# Patient Record
Sex: Female | Born: 1945 | Race: White | Hispanic: No | Smoking: Never smoker
Health system: Southern US, Community
[De-identification: ages and names within clinical notes are randomized; demographics above are authoritative.]

---

## 2000-10-02 ENCOUNTER — Emergency Department (HOSPITAL_COMMUNITY): Admission: EM | Admit: 2000-10-02 | Discharge: 2000-10-02 | Payer: Self-pay | Admitting: Emergency Medicine

## 2003-10-21 ENCOUNTER — Emergency Department (HOSPITAL_COMMUNITY): Admission: EM | Admit: 2003-10-21 | Discharge: 2003-10-21 | Payer: Self-pay | Admitting: Emergency Medicine

## 2007-07-07 ENCOUNTER — Emergency Department: Payer: Self-pay | Admitting: Emergency Medicine

## 2016-03-29 ENCOUNTER — Ambulatory Visit
Admission: EM | Admit: 2016-03-29 | Discharge: 2016-03-29 | Disposition: A | Discharge status: Home / Self Care | Payer: Medicare Other | Attending: Family Medicine | Admitting: Family Medicine

## 2016-03-29 ENCOUNTER — Encounter: Payer: Self-pay | Admitting: Emergency Medicine

## 2016-03-29 DIAGNOSIS — H6991 Unspecified Eustachian tube disorder, right ear: Secondary | ICD-10-CM

## 2016-03-29 NOTE — ED Triage Notes (Signed)
Patient c/o sinus pain and congestion for a week.  Patient states that she was seen and started on an antibiotic on 9/12.  Patient states that the antibiotic was so strong that she did not take the last 3 doses of her antibiotic. Patient states she still has low grade fevers.

## 2016-03-29 NOTE — Discharge Instructions (Signed)
Recommend over the counter antihistamine (claritin or zyrtec) Recommend over the counter flonase

## 2016-03-29 NOTE — ED Provider Notes (Signed)
[facility]    CSN: [id number] Arrival date & time: 03/29/16  [id number]  First Provider Contact:  None       History   Chief Complaint Chief Complaint  Patient presents with  . Facial Pain    HPI Misty Barry is a 70 y.o. female.   70 yo female with a c/o slight congestion, head feels "foggy" and feels "feverish" at night for the last 3 days. Has not taken her temperature at home when she's felt hot. Denies any chest pain, shortness of breath, cough. States was seen by PCP on 9/12 and prescribed doxycycline for bronchitis and completed 9 days of the antibiotic.    The history is provided by the patient.    History reviewed. No pertinent past medical history.  There are no active problems to display for this patient.   History reviewed. No pertinent surgical history.  OB History    No data available       Home Medications    Prior to Admission medications   Medication Sig Start Date End Date Taking? Authorizing Provider  lisinopril (PRINIVIL,ZESTRIL) 10 MG tablet Take 10 mg by mouth daily.   Yes Historical Provider, MD    Family History History reviewed. No pertinent family history.  Social History Social History  Substance Use Topics  . Smoking status: Never Smoker  . Smokeless tobacco: Never Used  . Alcohol use No     Allergies   Review of patient's allergies indicates no known allergies.   Review of Systems Review of Systems   Physical Exam Triage Vital Signs ED Triage Vitals  Enc Vitals Group     BP 03/29/16 0907 132/71     Pulse Rate 03/29/16 0907 70     Resp 03/29/16 0907 16     Temp 03/29/16 0907 97.9 F (36.6 C)     Temp Source 03/29/16 0907 Tympanic     SpO2 03/29/16 0907 100 %     Weight 03/29/16 0908 96 lb (43.5 kg)     Height 03/29/16 0908 5\' 2"  (1.575 m)     Head Circumference --      Peak Flow --      Pain Score 03/29/16 0910 0     Pain Loc --      Pain Edu? --      Excl. in GC? --    No data  found.   Updated Vital Signs BP 132/71 (BP Location: Right Arm)   Pulse 70   Temp 97.9 F (36.6 C) (Tympanic)   Resp 16   Ht 5\' 2"  (1.575 m)   Wt 96 lb (43.5 kg)   SpO2 100%   BMI 17.56 kg/m   Visual Acuity Right Eye Distance:   Left Eye Distance:   Bilateral Distance:    Right Eye Near:   Left Eye Near:    Bilateral Near:     Physical Exam  Constitutional: She appears well-developed and well-nourished. No distress.  HENT:  Head: Normocephalic and atraumatic.  Right Ear: Hearing, external ear and ear canal normal. A middle ear effusion is present.  Left Ear: Tympanic membrane, external ear and ear canal normal.  Nose: Rhinorrhea present. No mucosal edema, nose lacerations, sinus tenderness, nasal deformity, septal deviation or nasal septal hematoma. No epistaxis.  No foreign bodies. Right sinus exhibits no maxillary sinus tenderness and no frontal sinus tenderness. Left sinus exhibits no maxillary sinus tenderness and no frontal sinus tenderness.  Mouth/Throat: Uvula is midline, oropharynx is  clear and moist and mucous membranes are normal. No oropharyngeal exudate.  Eyes: Conjunctivae and EOM are normal. Pupils are equal, round, and reactive to light. Right eye exhibits no discharge. Left eye exhibits no discharge. No scleral icterus.  Neck: Normal range of motion. Neck supple. No thyromegaly present.  Cardiovascular: Normal rate, regular rhythm and normal heart sounds.   Pulmonary/Chest: Effort normal and breath sounds normal. No respiratory distress. She has no wheezes. She has no rales.  Lymphadenopathy:    She has no cervical adenopathy.  Skin: She is not diaphoretic.  Nursing note and vitals reviewed.    UC Treatments / Results  Labs (all labs ordered are listed, but only abnormal results are displayed) Labs Reviewed - No data to display  EKG  EKG Interpretation None       Radiology No results found.  Procedures Procedures (including critical care  time)  Medications Ordered in UC Medications - No data to display   Initial Impression / Assessment and Plan / UC Course  I have reviewed the triage vital signs and the nursing notes.  Pertinent labs & imaging results that were available during my care of the patient were reviewed by me and considered in my medical decision making (see chart for details).  Clinical Course      Final Clinical Impressions(s) / UC Diagnoses   Final diagnoses:  Eustachian tube disorder, right    New Prescriptions New Prescriptions   No medications on file   1. diagnosis reviewed with patient; explained to patient no evidence of bacterial infection requiring another course of antibioitic 2. Recommend supportive treatment with otc antihistamine and flonase nasal spray 4. Follow-up prn if symptoms worsen or don't improve   Payton Mccallum, MD 03/29/16 1028

## 2016-03-31 ENCOUNTER — Telehealth: Payer: Self-pay

## 2016-03-31 NOTE — Telephone Encounter (Signed)
Courtesy call back completed today after patient's visit at [facility]. Patient improved and will call back with any questions or concerns.  

## 2020-05-19 ENCOUNTER — Other Ambulatory Visit: Payer: Self-pay

## 2020-05-19 ENCOUNTER — Emergency Department: Payer: Medicare Other

## 2020-05-19 ENCOUNTER — Emergency Department
Admission: EM | Admit: 2020-05-19 | Discharge: 2020-05-19 | Disposition: A | Discharge status: Home / Self Care | Payer: Medicare Other | Attending: Student in an Organized Health Care Education/Training Program | Admitting: Student in an Organized Health Care Education/Training Program

## 2020-05-19 DIAGNOSIS — Z79899 Other long term (current) drug therapy: Secondary | ICD-10-CM | POA: Insufficient documentation

## 2020-05-19 DIAGNOSIS — I10 Essential (primary) hypertension: Secondary | ICD-10-CM | POA: Diagnosis not present

## 2020-05-19 DIAGNOSIS — R112 Nausea with vomiting, unspecified: Secondary | ICD-10-CM | POA: Insufficient documentation

## 2020-05-19 DIAGNOSIS — Z7901 Long term (current) use of anticoagulants: Secondary | ICD-10-CM | POA: Diagnosis not present

## 2020-05-19 DIAGNOSIS — R42 Dizziness and giddiness: Secondary | ICD-10-CM | POA: Diagnosis not present

## 2020-05-19 LAB — COMPREHENSIVE METABOLIC PANEL
ALT: 23 U/L (ref 0–44)
AST: 27 U/L (ref 15–41)
Albumin: 3.8 g/dL (ref 3.5–5.0)
Alkaline Phosphatase: 66 U/L (ref 38–126)
Anion gap: 11 (ref 5–15)
BUN: 13 mg/dL (ref 8–23)
CO2: 26 mmol/L (ref 22–32)
Calcium: 8.7 mg/dL — ABNORMAL LOW (ref 8.9–10.3)
Chloride: 103 mmol/L (ref 98–111)
Creatinine, Ser: 0.74 mg/dL (ref 0.44–1.00)
GFR, Estimated: >60 mL/min (ref >60)
Glucose, Bld: 114 mg/dL — ABNORMAL HIGH (ref 70–99)
Potassium: 3.4 mmol/L — ABNORMAL LOW (ref 3.5–5.1)
Sodium: 140 mmol/L (ref 135–145)
Total Bilirubin: 0.8 mg/dL (ref 0.3–1.2)
Total Protein: 6.7 g/dL (ref 6.5–8.1)

## 2020-05-19 LAB — CBC WITH DIFFERENTIAL/PLATELET
Abs Immature Granulocytes: 0.02 10*3/uL (ref 0.00–0.07)
Basophils Absolute: 0.1 10*3/uL (ref 0.0–0.1)
Basophils Relative: 1 %
Eosinophils Absolute: 0.1 10*3/uL (ref 0.0–0.5)
Eosinophils Relative: 3 %
HCT: 41.2 % (ref 36.0–46.0)
Hemoglobin: 13.4 g/dL (ref 12.0–15.0)
Immature Granulocytes: 0 %
Lymphocytes Relative: 24 %
Lymphs Abs: 1.3 10*3/uL (ref 0.7–4.0)
MCH: 29.1 pg (ref 26.0–34.0)
MCHC: 32.5 g/dL (ref 30.0–36.0)
MCV: 89.6 fL (ref 80.0–100.0)
Monocytes Absolute: 0.4 10*3/uL (ref 0.1–1.0)
Monocytes Relative: 7 %
Neutro Abs: 3.6 10*3/uL (ref 1.7–7.7)
Neutrophils Relative %: 65 %
Platelets: 207 10*3/uL (ref 150–400)
RBC: 4.6 MIL/uL (ref 3.87–5.11)
RDW: 13.3 % (ref 11.5–15.5)
WBC: 5.6 10*3/uL (ref 4.0–10.5)
nRBC: 0 % (ref 0.0–0.2)

## 2020-05-19 LAB — URINALYSIS, COMPLETE (UACMP) WITH MICROSCOPIC
Bacteria, UA: NONE SEEN
Bilirubin Urine: NEGATIVE
Glucose, UA: NEGATIVE mg/dL
Hgb urine dipstick: NEGATIVE
Ketones, ur: 5 mg/dL — AB
Leukocytes,Ua: NEGATIVE
Nitrite: NEGATIVE
Protein, ur: NEGATIVE mg/dL
Specific Gravity, Urine: 1.013 (ref 1.005–1.030)
pH: 6 (ref 5.0–8.0)

## 2020-05-19 MED ORDER — SODIUM CHLORIDE 0.9 % IV BOLUS
500.0000 mL | Freq: Once | INTRAVENOUS | Status: AC
Start: 2020-05-19 — End: 2020-05-19
  Administered 2020-05-19: 500 mL via INTRAVENOUS

## 2020-05-19 MED ORDER — MECLIZINE HCL 25 MG PO TABS
25.0000 mg | ORAL_TABLET | Freq: Once | ORAL | Status: AC
  Administered 2020-05-19: 25 mg via ORAL
  Filled 2020-05-19: qty 1

## 2020-05-19 MED ORDER — LORAZEPAM 0.5 MG PO TABS
0.5000 mg | ORAL_TABLET | Freq: Once | ORAL | Status: AC
  Administered 2020-05-19: 0.5 mg via ORAL
  Filled 2020-05-19: qty 1

## 2020-05-19 MED ORDER — MECLIZINE HCL 12.5 MG PO TABS: 12.5000 mg | ORAL_TABLET | Freq: Three times a day (TID) | ORAL | 0 refills | Status: AC | PRN

## 2020-05-19 MED ORDER — ONDANSETRON HCL 4 MG/2ML IJ SOLN
4.0000 mg | Freq: Once | INTRAMUSCULAR | Status: AC
Start: 2020-05-19 — End: 2020-05-19
  Administered 2020-05-19: 4 mg via INTRAVENOUS
  Filled 2020-05-19: qty 2

## 2020-05-19 NOTE — ED Notes (Signed)
Pt assisted to restroom at this time. States felt dizzy, though gait steady. Pt then assisted to bed at this time and MRI tech at bedside to transport to MRI.

## 2020-05-19 NOTE — ED Provider Notes (Signed)
[facility] Emergency Department Provider Note    First MD Initiated Contact with Patient 05/19/20 [id number]     (approximate)  I have reviewed the triage vital signs and the nursing notes.   HISTORY  Chief Complaint Dizziness    HPI Misty Barry is a 74 y.o. female no significant past medical history other than hypertension presents to the ER for evaluation of dizziness started this morning.  Felt like it was fairly abrupt in onset is worse when she looks about.  Associate with nausea vomiting.  Has never had symptoms like this before.  No associated numbness or tingling or weakness.  Is not having double vision.  No blurry vision.  Does not have a headache.  She is on blood thinners.    History reviewed. No pertinent past medical history. History reviewed. No pertinent family history. History reviewed. No pertinent surgical history. There are no problems to display for this patient.     Prior to Admission medications   Medication Sig Start Date End Date Taking? Authorizing Provider  lisinopril-hydrochlorothiazide (ZESTORETIC) 10-12.5 MG tablet Take 1 tablet by mouth daily with lunch.  04/21/20  Yes [provider]  meclizine (ANTIVERT) 12.5 MG tablet Take 1 tablet (12.5 mg total) by mouth 3 (three) times daily as needed for dizziness. 05/19/20   Willy Eddy, MD    Allergies Patient has no known allergies.    Social History Social History   Tobacco Use  . Smoking status: Never Smoker  . Smokeless tobacco: Never Used  Substance Use Topics  . Alcohol use: No  . Drug use: No    Review of Systems Patient denies headaches, rhinorrhea, blurry vision, numbness, shortness of breath, chest pain, edema, cough, abdominal pain, nausea, vomiting, diarrhea, dysuria, fevers, rashes or hallucinations unless otherwise stated above in HPI. ____________________________________________   PHYSICAL EXAM:  VITAL SIGNS: Vitals:   05/19/20 1130  05/19/20 1200  BP: (!) 148/71 (!) 147/69  Pulse: 70 71  Resp: 13 12  Temp:    SpO2: 97% 97%    Constitutional: Alert and oriented.  Eyes: Conjunctivae are normal.  Head: Atraumatic. Nose: No congestion/rhinnorhea. Mouth/Throat: Mucous membranes are moist.   Neck: No stridor. Painless ROM.  Cardiovascular: Normal rate, regular rhythm. Grossly normal heart sounds.  Good peripheral circulation. Respiratory: Normal respiratory effort.  No retractions. Lungs CTAB. Gastrointestinal: Soft and nontender. No distention. No abdominal bruits. No CVA tenderness. Genitourinary:  Musculoskeletal: No lower extremity tenderness nor edema.  No joint effusions. Neurologic:  Normal speech and language. No gross focal neurologic deficits are appreciated. No facial droop. Does have lateral nystagmus. Otherwise reassuring hints exam. Normal finger-nose-finger. Does have some unsteadiness with sitting up quickly and standing. Skin:  Skin is warm, dry and intact. No rash noted. Psychiatric: Mood and affect are normal. Speech and behavior are normal.  ____________________________________________   LABS (all labs ordered are listed, but only abnormal results are displayed)  Results for orders placed or performed during the hospital encounter of 05/19/20 (from the past 24 hour(s))  CBC with Differential     Status: None   Collection Time: 05/19/20  9:31 AM  Result Value Ref Range   WBC 5.6 4.0 - 10.5 K/uL   RBC 4.60 3.87 - 5.11 MIL/uL   Hemoglobin 13.4 12.0 - 15.0 g/dL   HCT 86.1 36 - 46 %   MCV 89.6 80.0 - 100.0 fL   MCH 29.1 26.0 - 34.0 pg   MCHC 32.5 30.0 - 36.0  g/dL   RDW 18.2 99.3 - 71.6 %   Platelets 207 150 - 400 K/uL   nRBC 0.0 0.0 - 0.2 %   Neutrophils Relative % 65 %   Neutro Abs 3.6 1.7 - 7.7 K/uL   Lymphocytes Relative 24 %   Lymphs Abs 1.3 0.7 - 4.0 K/uL   Monocytes Relative 7 %   Monocytes Absolute 0.4 0.1 - 1.0 K/uL   Eosinophils Relative 3 %   Eosinophils Absolute 0.1 0.0 - 0.5  K/uL   Basophils Relative 1 %   Basophils Absolute 0.1 0.0 - 0.1 K/uL   Immature Granulocytes 0 %   Abs Immature Granulocytes 0.02 0.00 - 0.07 K/uL  Comprehensive metabolic panel     Status: Abnormal   Collection Time: 05/19/20  9:31 AM  Result Value Ref Range   Sodium 140 135 - 145 mmol/L   Potassium 3.4 (L) 3.5 - 5.1 mmol/L   Chloride 103 98 - 111 mmol/L   CO2 26 22 - 32 mmol/L   Glucose, Bld 114 (H) 70 - 99 mg/dL   BUN 13 8 - 23 mg/dL   Creatinine, Ser 9.67 0.44 - 1.00 mg/dL   Calcium 8.7 (L) 8.9 - 10.3 mg/dL   Total Protein 6.7 6.5 - 8.1 g/dL   Albumin 3.8 3.5 - 5.0 g/dL   AST 27 15 - 41 U/L   ALT 23 0 - 44 U/L   Alkaline Phosphatase 66 38 - 126 U/L   Total Bilirubin 0.8 0.3 - 1.2 mg/dL   GFR, Estimated >89 >38 mL/min   Anion gap 11 5 - 15  Urinalysis, Complete w Microscopic Urine, Clean Catch     Status: Abnormal   Collection Time: 05/19/20  9:31 AM  Result Value Ref Range   Color, Urine YELLOW (A) YELLOW   APPearance HAZY (A) CLEAR   Specific Gravity, Urine 1.013 1.005 - 1.030   pH 6.0 5.0 - 8.0   Glucose, UA NEGATIVE NEGATIVE mg/dL   Hgb urine dipstick NEGATIVE NEGATIVE   Bilirubin Urine NEGATIVE NEGATIVE   Ketones, ur 5 (A) NEGATIVE mg/dL   Protein, ur NEGATIVE NEGATIVE mg/dL   Nitrite NEGATIVE NEGATIVE   Leukocytes,Ua NEGATIVE NEGATIVE   RBC / HPF 0-5 0 - 5 RBC/hpf   WBC, UA 0-5 0 - 5 WBC/hpf   Bacteria, UA NONE SEEN NONE SEEN   Squamous Epithelial / LPF 0-5 0 - 5   Mucus PRESENT    ____________________________________________  EKG My review and personal interpretation at Time: 9:23   Indication: dizziness  Rate: 70  Rhythm: sinus Axis: normal Other: normal intervals, no stemi ____________________________________________  RADIOLOGY  I personally reviewed all radiographic images ordered to evaluate for the above acute complaints and reviewed radiology reports and findings.  These findings were personally discussed with the patient.  Please see medical  record for radiology report.  ____________________________________________   PROCEDURES  Procedure(s) performed:  Procedures    Critical Care performed: no ____________________________________________   INITIAL IMPRESSION / ASSESSMENT AND PLAN / ED COURSE  Pertinent labs & imaging results that were available during my care of the patient were reviewed by me and considered in my medical decision making (see chart for details).   DDX: Hydration, vertigo, BPPV, CVA, bleed, electrolyte abnormality  Misty Barry is a 74 y.o. who presents to the ED with presentation as described above. Patient on toxic appearing presenting with vague dizziness nausea. Abdominal exam is soft and benign. Nausea seems to be secondary to spinning  sensation. Question whether this is peripheral vertigo. Will order imaging. She is noted to be hypertensive and does have risk factors for CVA. Does not meet criteria for stroke.  Clinical Course as of May 19 1338  Wynelle Link May 19, 2020  1107 Does feel improved.  Given her age risk factors of vague symptoms will order MRI to exclude CVA   [PR]  1334 Imaging is reassuring. Presentation most consistent with vertigo. She feels significant improvement after meclizine. No other symptoms reported. Able to ambulate. Does appear stable and appropriate for outpatient follow-up.   [PR]    Clinical Course User Index [PR] Willy Eddy, MD    The patient was evaluated in Emergency Department today for the symptoms described in the history of present illness. He/she was evaluated in the context of the global COVID-19 pandemic, which necessitated consideration that the patient might be at risk for infection with the SARS-CoV-2 virus that causes COVID-19. Institutional protocols and algorithms that pertain to the evaluation of patients at risk for COVID-19 are in a state of rapid change based on information released by regulatory bodies including the CDC and federal and state  organizations. These policies and algorithms were followed during the patient's care in the ED.  As part of my medical decision making, I reviewed the following data within the electronic MEDICAL RECORD NUMBER Nursing notes reviewed and incorporated, Labs reviewed, notes from prior ED visits and [place] Cont[place] Sub[place] Database   ____________________________________________   FINAL CLINICAL IMPRESSION(S) / ED DIAGNOSES  Final diagnoses:  Dizziness      NEW MEDICATIONS STARTED DURING THIS VISIT:  New Prescriptions   MECLIZINE (ANTIVERT) 12.5 MG TABLET    Take 1 tablet (12.5 mg total) by mouth 3 (three) times daily as needed for dizziness.     Note:  This document was prepared using Dragon voice recognition software and may include unintentional dictation errors.    Willy Eddy, MD 05/19/20 1339

## 2020-05-19 NOTE — ED Triage Notes (Signed)
Reported via [facility] due nausea and dizziness that started at 7am today.

## 2020-05-19 NOTE — ED Notes (Signed)
Pt returned from MRI at this time

## 2022-05-29 IMAGING — MR MR HEAD W/O CM
13 series · 48 of 48 positions shown · non-contrast
Comparison: Head CT same day

CLINICAL DATA: Nonspecific dizziness. Nausea. Symptoms began 7 a.m.
today.

EXAM:
MRI HEAD WITHOUT CONTRAST
TECHNIQUE: Multiplanar, multiecho pulse sequences of the brain and surrounding
structures were obtained without intravenous contrast.

[Series 2: ax dwi_tracew · axial · 3.0mm · 0.71mm/px · z∈[-60,+104]mm · 8 of 112 slices shown]
[im 1/112]
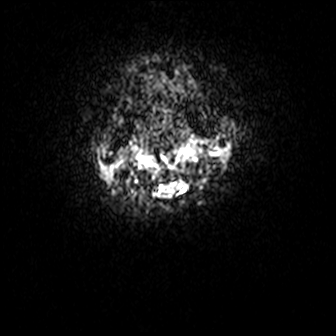
[im 16/112]
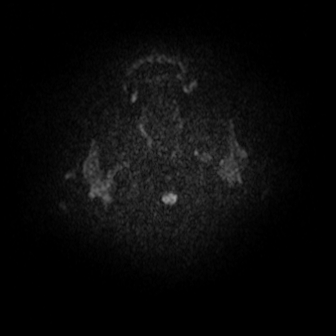
[im 32/112]
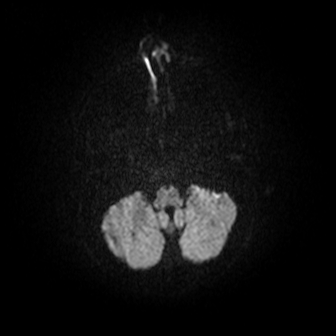
[im 48/112]
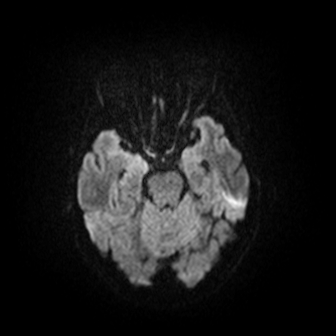
[im 64/112]
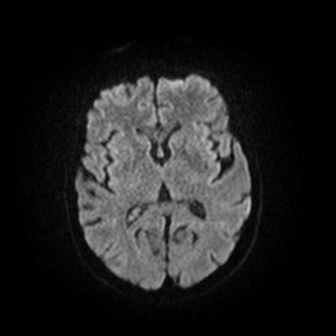
[im 80/112]
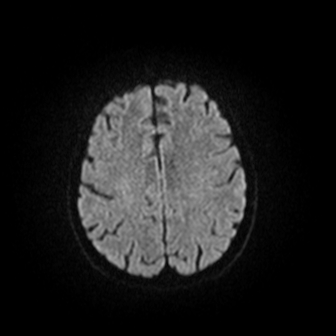
[im 96/112]
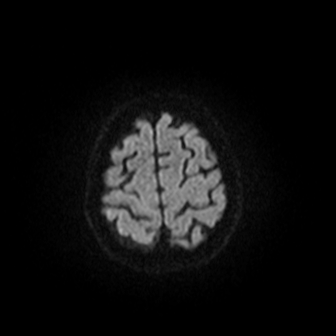
[im 112/112]
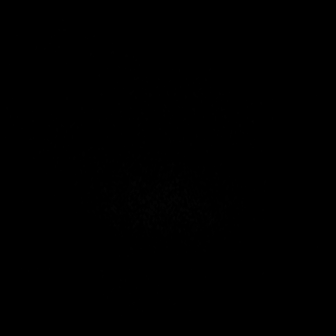

[Series 3: ax dwi_adc · axial · 3.0mm · 0.71mm/px · z∈[-60,+101]mm · 4 of 55 slices shown]
[im 1/55]
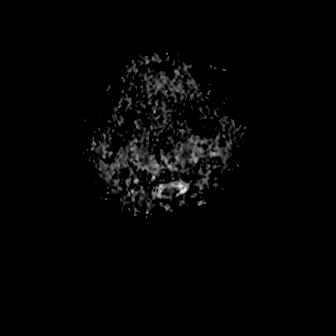
[im 19/55]
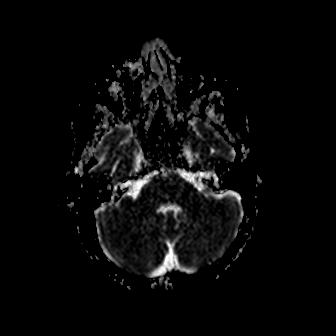
[im 37/55]
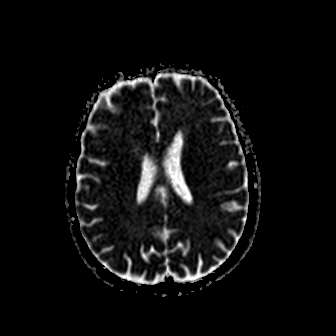
[im 55/55]
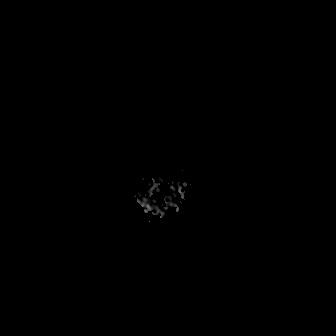

[Series 4: cor dwi_tracew · coronal · 5.0mm · 0.68mm/px · 5 of 72 slices shown]
[im 1/72]
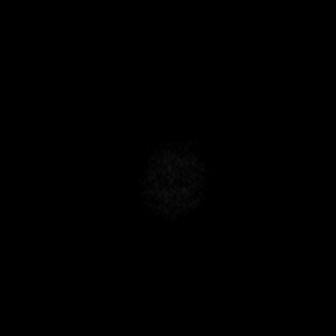
[im 18/72]
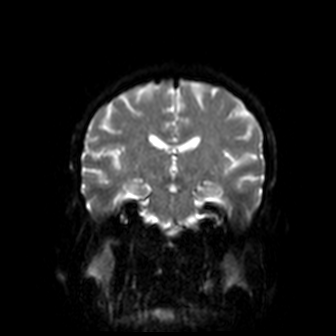
[im 36/72]
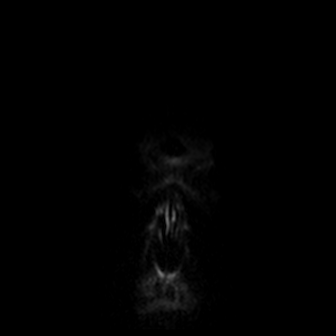
[im 54/72]
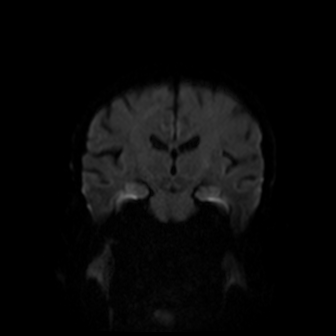
[im 72/72]
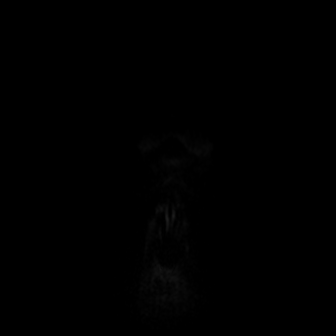

[Series 5: cor dwi_adc · coronal · 5.0mm · 0.68mm/px · 2 of 35 slices shown]
[im 1/35]
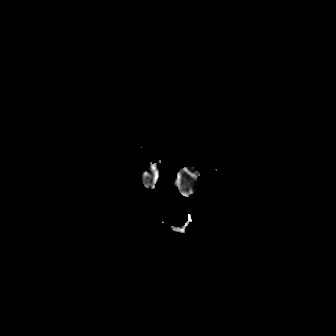
[im 35/35]
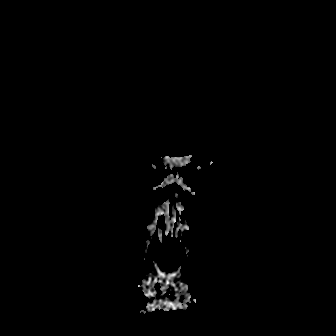

[Series 6: T1 · sagittal · 5.0mm · 0.94mm/px · 1 of 21 slices shown (1 of 2)]
[im 1/21]
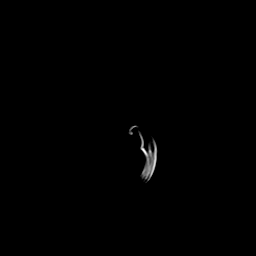

[Series 7: T2 · axial · 5.0mm · 0.45mm/px · z∈[-57,+98]mm · 2 of 27 slices shown (1 of 2)]
[im 1/27]
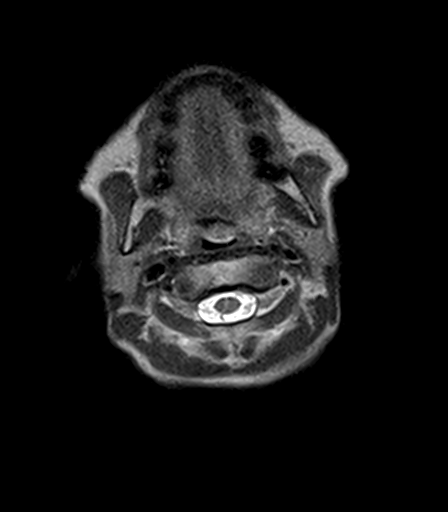
[im 27/27]
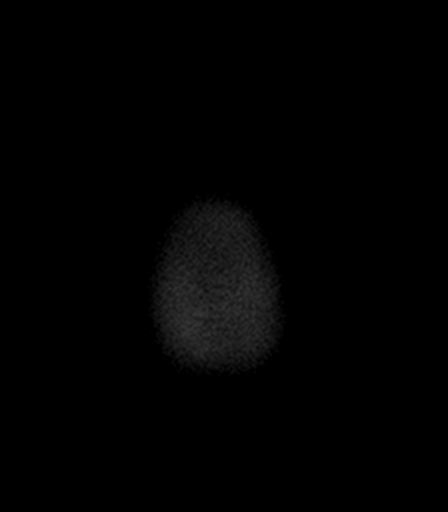

[Series 8: mag_images · axial · 4.0mm · 0.90mm/px · z∈[-43,+97]mm · 2 of 36 slices shown]
[im 1/36]
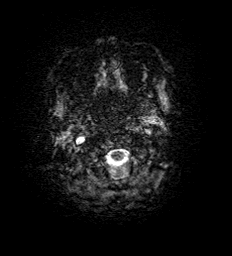
[im 36/36]
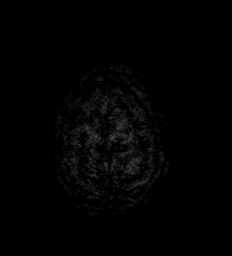

[Series 9: pha_images · axial · 4.0mm · 0.90mm/px · z∈[-43,+93]mm · 2 of 35 slices shown]
[im 1/35]
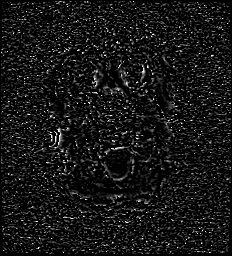
[im 35/35]
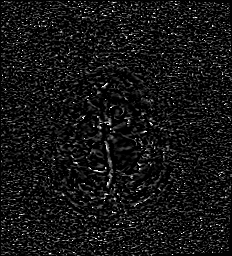

[Series 10: swi_images · axial · 4.0mm · 0.90mm/px · z∈[-43,+97]mm · 2 of 36 slices shown]
[im 1/36]
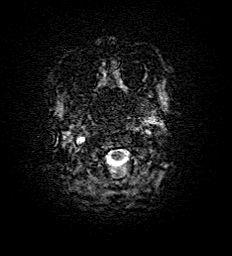
[im 36/36]
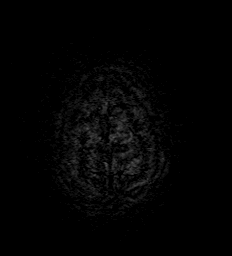

[Series 11: mip_images(sw) · axial · 32.0mm · 0.90mm/px · z∈[-29,+83]mm · 2 of 29 slices shown]
[im 1/29]
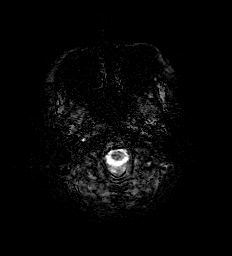
[im 29/29]
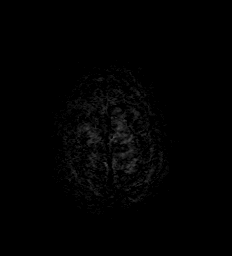

[Series 12: FLAIR · axial · 3.0mm · 0.69mm/px · z∈[-58,+104]mm · 4 of 55 slices shown]
[im 1/55]
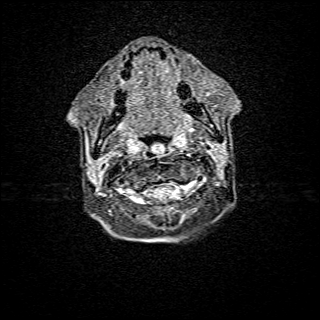
[im 19/55]
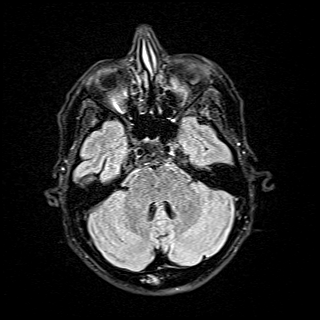
[im 37/55]
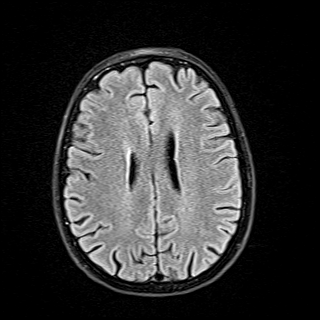
[im 55/55]
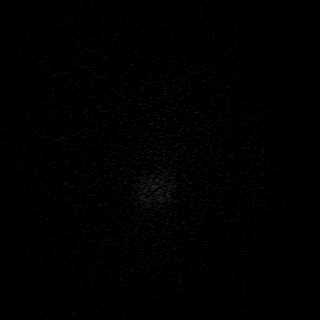

[Series 13: T1 · axial · 1.0mm · 0.98mm/px · z∈[-64,+110]mm · 12 of 175 slices shown (2 of 2)]
[im 1/175]
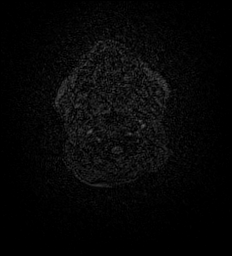
[im 16/175]
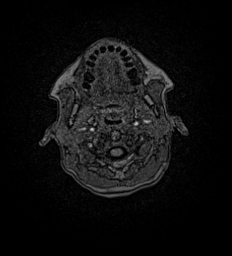
[im 32/175]
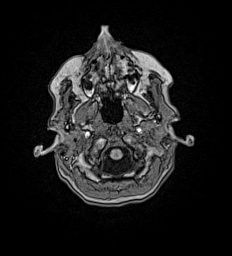
[im 48/175]
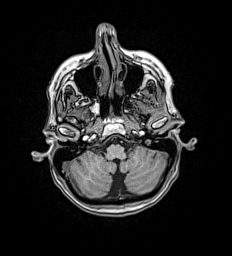
[im 64/175]
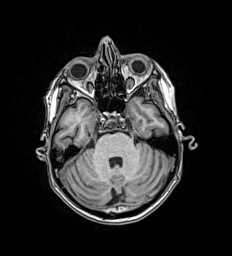
[im 80/175]
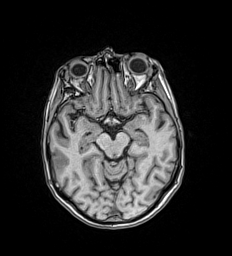
[im 95/175]
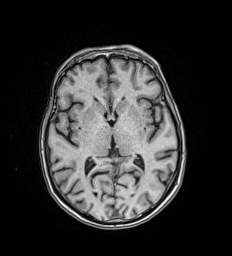
[im 111/175]
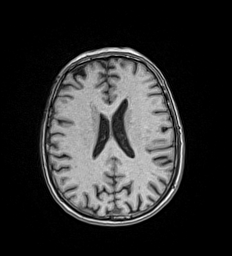
[im 127/175]
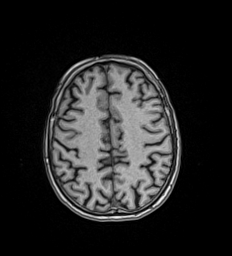
[im 143/175]
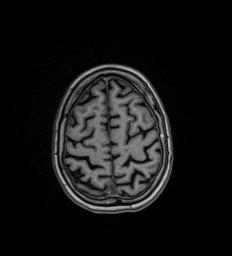
[im 159/175]
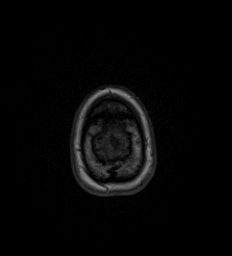
[im 175/175]
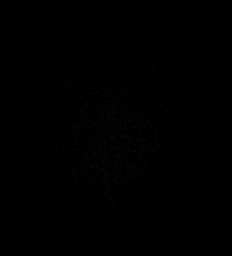

[Series 14: T2 · coronal · 5.0mm · 0.45mm/px · 2 of 31 slices shown (2 of 2)]
[im 1/31]
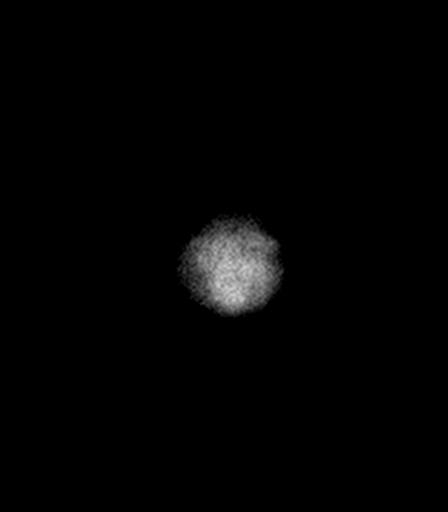
[im 31/31]
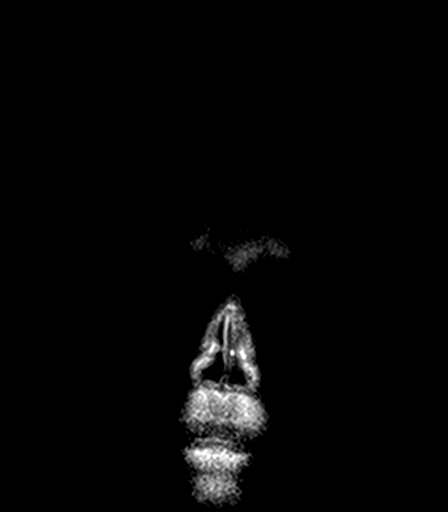

[48 of 48 positions shown; findings below may reference images not displayed]

FINDINGS: Brain: Diffusion imaging does not show any acute or subacute
infarction. The brainstem and cerebellum are normal. Cerebral
hemispheres show a few scattered punctate foci of T2 and FLAIR
signal in the white matter, usually indicating an early
manifestation of small vessel change. No cortical or large vessel
territory infarction. No intra-axial mass, hemorrhage, hydrocephalus
or extra-axial collection.

Vascular: Major vessels at the base of the brain show flow.

Skull and upper cervical spine: No skull or skull base lesion is
seen. 1 cm marrow space lesion within the C3 vertebral body. This is
of indeterminate nature but could not be differentiated from a bone
metastasis. Consider further evaluation depending on the full
clinical history.

Sinuses/Orbits: Normal

Other: None
IMPRESSION: 1. No acute intracranial finding. Normal appearance of the brain
with exception of a few scattered punctate foci of T2 and FLAIR
signal within the cerebral hemispheric white matter, usually
indicating an early manifestation of small vessel change.
2. 1 cm marrow space lesion within the C3 vertebral body. This is of
indeterminate nature but could not be differentiated from a bone
metastasis. Consider further evaluation depending on the full
clinical history.
